# Patient Record
Sex: Female | Born: 1940 | Race: White | Hispanic: No | Marital: Married | Smoking: Never smoker
Health system: Southern US, Community
[De-identification: ages and names within clinical notes are randomized; demographics above are authoritative.]

## PROBLEM LIST (undated history)

## (undated) DIAGNOSIS — K219 Gastro-esophageal reflux disease without esophagitis: Secondary | ICD-10-CM

## (undated) DIAGNOSIS — IMO0001 Reserved for inherently not codable concepts without codable children: Secondary | ICD-10-CM

## (undated) HISTORY — PX: NM ESOPHAGEAL REFLUX: HXRAD613

## (undated) HISTORY — PX: HAND SURGERY: SHX662

## (undated) HISTORY — PX: OOPHORECTOMY: SHX86

## (undated) HISTORY — PX: BREAST ENHANCEMENT SURGERY: SHX7

## (undated) HISTORY — PX: ABDOMINAL HYSTERECTOMY: SHX81

---

## 2011-04-10 ENCOUNTER — Emergency Department (HOSPITAL_BASED_OUTPATIENT_CLINIC_OR_DEPARTMENT_OTHER)
Admission: EM | Admit: 2011-04-10 | Discharge: 2011-04-10 | Disposition: A | Discharge status: Home / Self Care | Payer: Medicare Other | Attending: Emergency Medicine | Admitting: Emergency Medicine

## 2011-04-10 ENCOUNTER — Encounter: Payer: Self-pay | Admitting: *Deleted

## 2011-04-10 ENCOUNTER — Emergency Department (INDEPENDENT_AMBULATORY_CARE_PROVIDER_SITE_OTHER): Payer: Medicare Other

## 2011-04-10 DIAGNOSIS — M25579 Pain in unspecified ankle and joints of unspecified foot: Secondary | ICD-10-CM

## 2011-04-10 DIAGNOSIS — S93409A Sprain of unspecified ligament of unspecified ankle, initial encounter: Secondary | ICD-10-CM | POA: Insufficient documentation

## 2011-04-10 DIAGNOSIS — S93402A Sprain of unspecified ligament of left ankle, initial encounter: Secondary | ICD-10-CM

## 2011-04-10 DIAGNOSIS — J45909 Unspecified asthma, uncomplicated: Secondary | ICD-10-CM | POA: Insufficient documentation

## 2011-04-10 DIAGNOSIS — W108XXA Fall (on) (from) other stairs and steps, initial encounter: Secondary | ICD-10-CM | POA: Insufficient documentation

## 2011-04-10 DIAGNOSIS — W19XXXA Unspecified fall, initial encounter: Secondary | ICD-10-CM

## 2011-04-10 HISTORY — DX: Reserved for inherently not codable concepts without codable children: IMO0001

## 2011-04-10 HISTORY — DX: Gastro-esophageal reflux disease without esophagitis: K21.9

## 2011-04-10 MED ORDER — ACETAMINOPHEN 500 MG PO TABS
1000.0000 mg | ORAL_TABLET | Freq: Once | ORAL | Status: DC
  Filled 2011-04-10: qty 2

## 2011-04-10 MED ORDER — IBUPROFEN 800 MG PO TABS: 800.0000 mg | ORAL_TABLET | Freq: Three times a day (TID) | ORAL | Status: AC

## 2011-04-10 MED ORDER — IBUPROFEN 800 MG PO TABS: 800.0000 mg | ORAL_TABLET | Freq: Three times a day (TID) | ORAL | Status: DC

## 2011-04-10 MED ORDER — KETOROLAC TROMETHAMINE 60 MG/2ML IM SOLN: 60.0000 mg | Freq: Once | INTRAMUSCULAR | Status: DC

## 2011-04-10 MED ORDER — IBUPROFEN 800 MG PO TABS
800.0000 mg | ORAL_TABLET | Freq: Once | ORAL | Status: AC
  Administered 2011-04-10: 800 mg via ORAL
  Filled 2011-04-10: qty 1

## 2011-04-10 NOTE — ED Notes (Signed)
Pt states she slipped off a step and injured her left ankle about 1pm today. Swelling at site.

## 2011-04-10 NOTE — ED Provider Notes (Signed)
History     Chief Complaint  Patient presents with  . Ankle Pain   HPI Comments: Patient fell down 3 steps several hours prior to arrival when she lost her balance. She fell on her left ankle causing a twisting motion. She had acute onset of pain, gradual onset of swelling and inability to bear without without severe pain. Symptoms are constant, moderate and has been treated with an ice pack prior to arrival.  Patient is a 70 y.o. female presenting with ankle pain. The history is provided by the patient and a relative.  Ankle Pain  Pertinent negatives include no numbness.    Past Medical History  Diagnosis Date  . Asthma   . Reflux     Past Surgical History  Procedure Date  . Hand surgery   . Breast enhancement surgery   . Nm esophageal reflux   . Cesarean section   . Oophorectomy   . Abdominal hysterectomy     History reviewed. No pertinent family history.  History  Substance Use Topics  . Smoking status: Never Smoker   . Smokeless tobacco: Not on file  . Alcohol Use: No    OB History    Grav Para Term Preterm Abortions TAB SAB Ect Mult Living                  Review of Systems  Gastrointestinal: Negative for nausea and vomiting.  Musculoskeletal: Positive for joint swelling.  Neurological: Negative for weakness and numbness.    Physical Exam  BP 122/57  Pulse 74  Temp(Src) 98 F (36.7 C) (Oral)  Resp 20  Ht 5\' 7"  (1.702 m)  Wt 220 lb (99.791 kg)  BMI 34.46 kg/m2  SpO2 98%  Physical Exam  Constitutional: She appears well-developed and well-nourished. No distress.  HENT:  Head: Normocephalic and atraumatic.  Eyes: Conjunctivae are normal. No scleral icterus.  Cardiovascular: Normal rate, regular rhythm and intact distal pulses.   Pulmonary/Chest: Effort normal and breath sounds normal.  Musculoskeletal: She exhibits tenderness. She exhibits no edema.       Left ankle with mild tenderness to palpation below the bilateral malleoli tips.  The  swelling in the same area. Normal pulses at the feet  Neurological: She is alert.       Normal sensation to touch bilateral feet.  Skin: Skin is warm and dry. No rash noted. She is not diaphoretic.    ED Course  Procedures  MDM X-rays reveal no signs of fracture.  Treated with Rice therapy including Toradol intramuscular, Ace wrap, elevation, ice pack.      Vida Roller, MD 04/10/11 [phone]

## 2012-07-10 IMAGING — CR DG ANKLE COMPLETE 3+V*L*
3 series · 3 of 3 positions shown · non-contrast
Comparison: None.

CLINICAL DATA: Fell, pain

LEFT ANKLE COMPLETE - 3+ VIEW

[t ankle joint ap left]
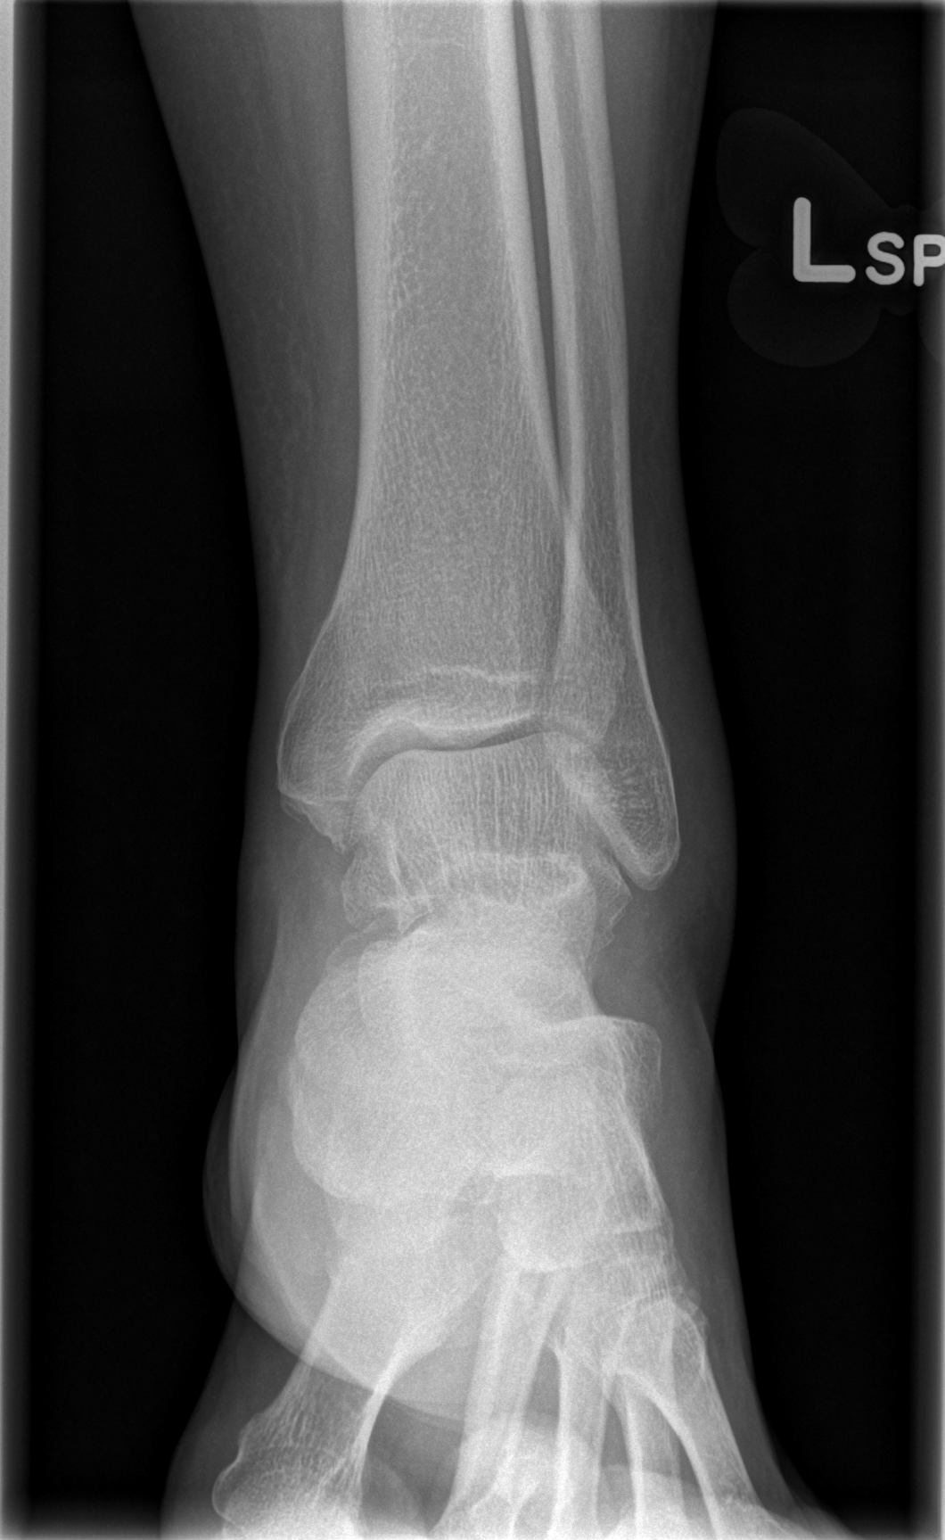

[t ankle joint oblique left]
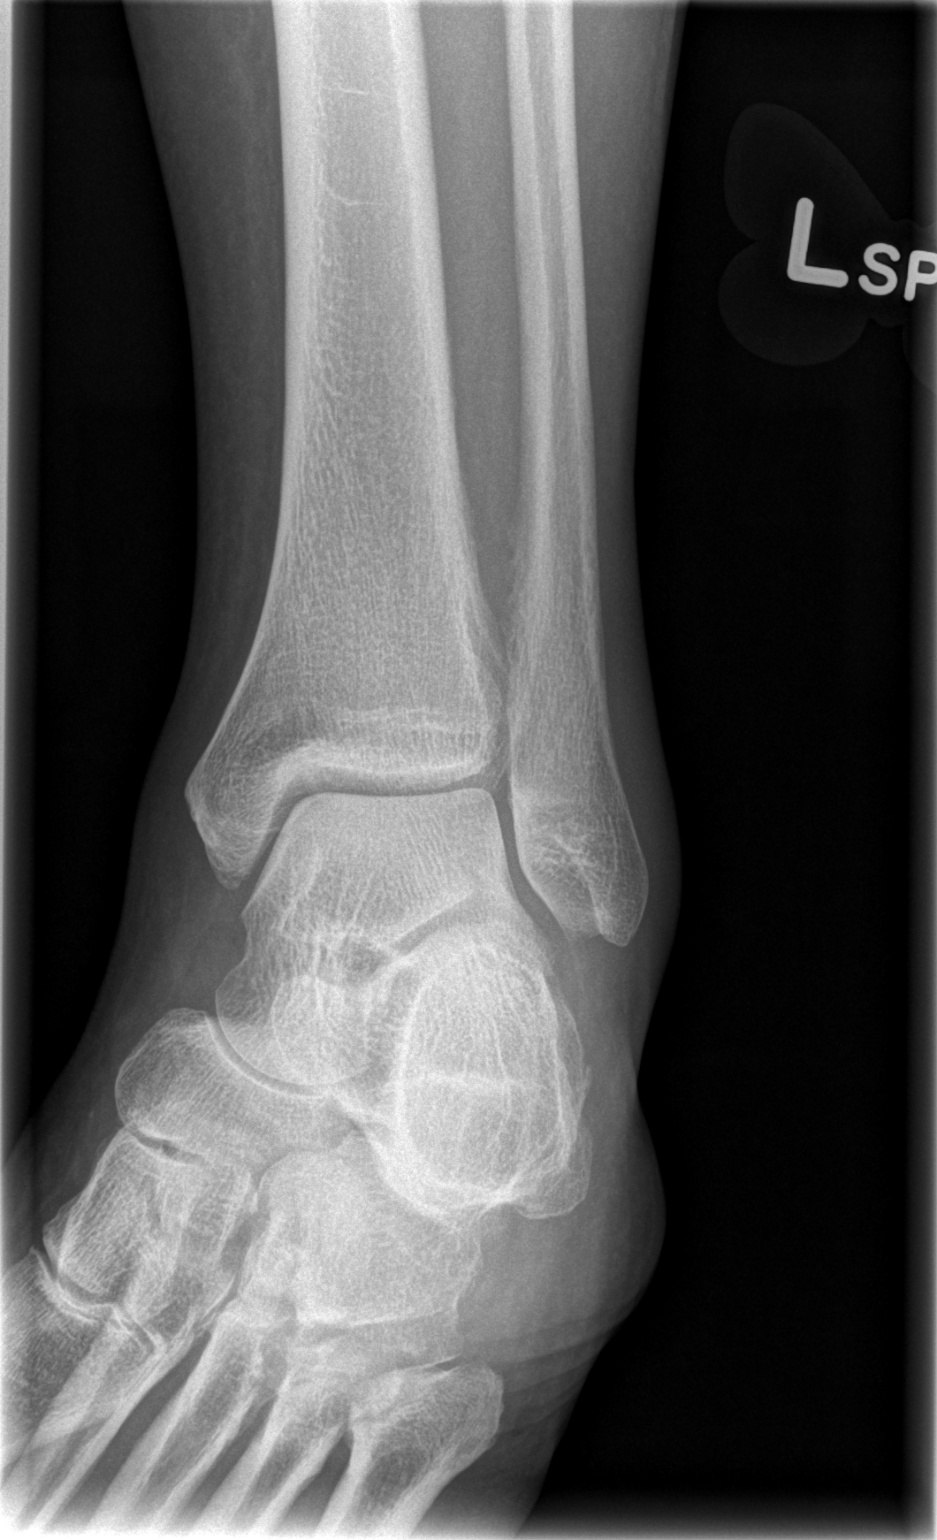

[t ankle joint lat left]
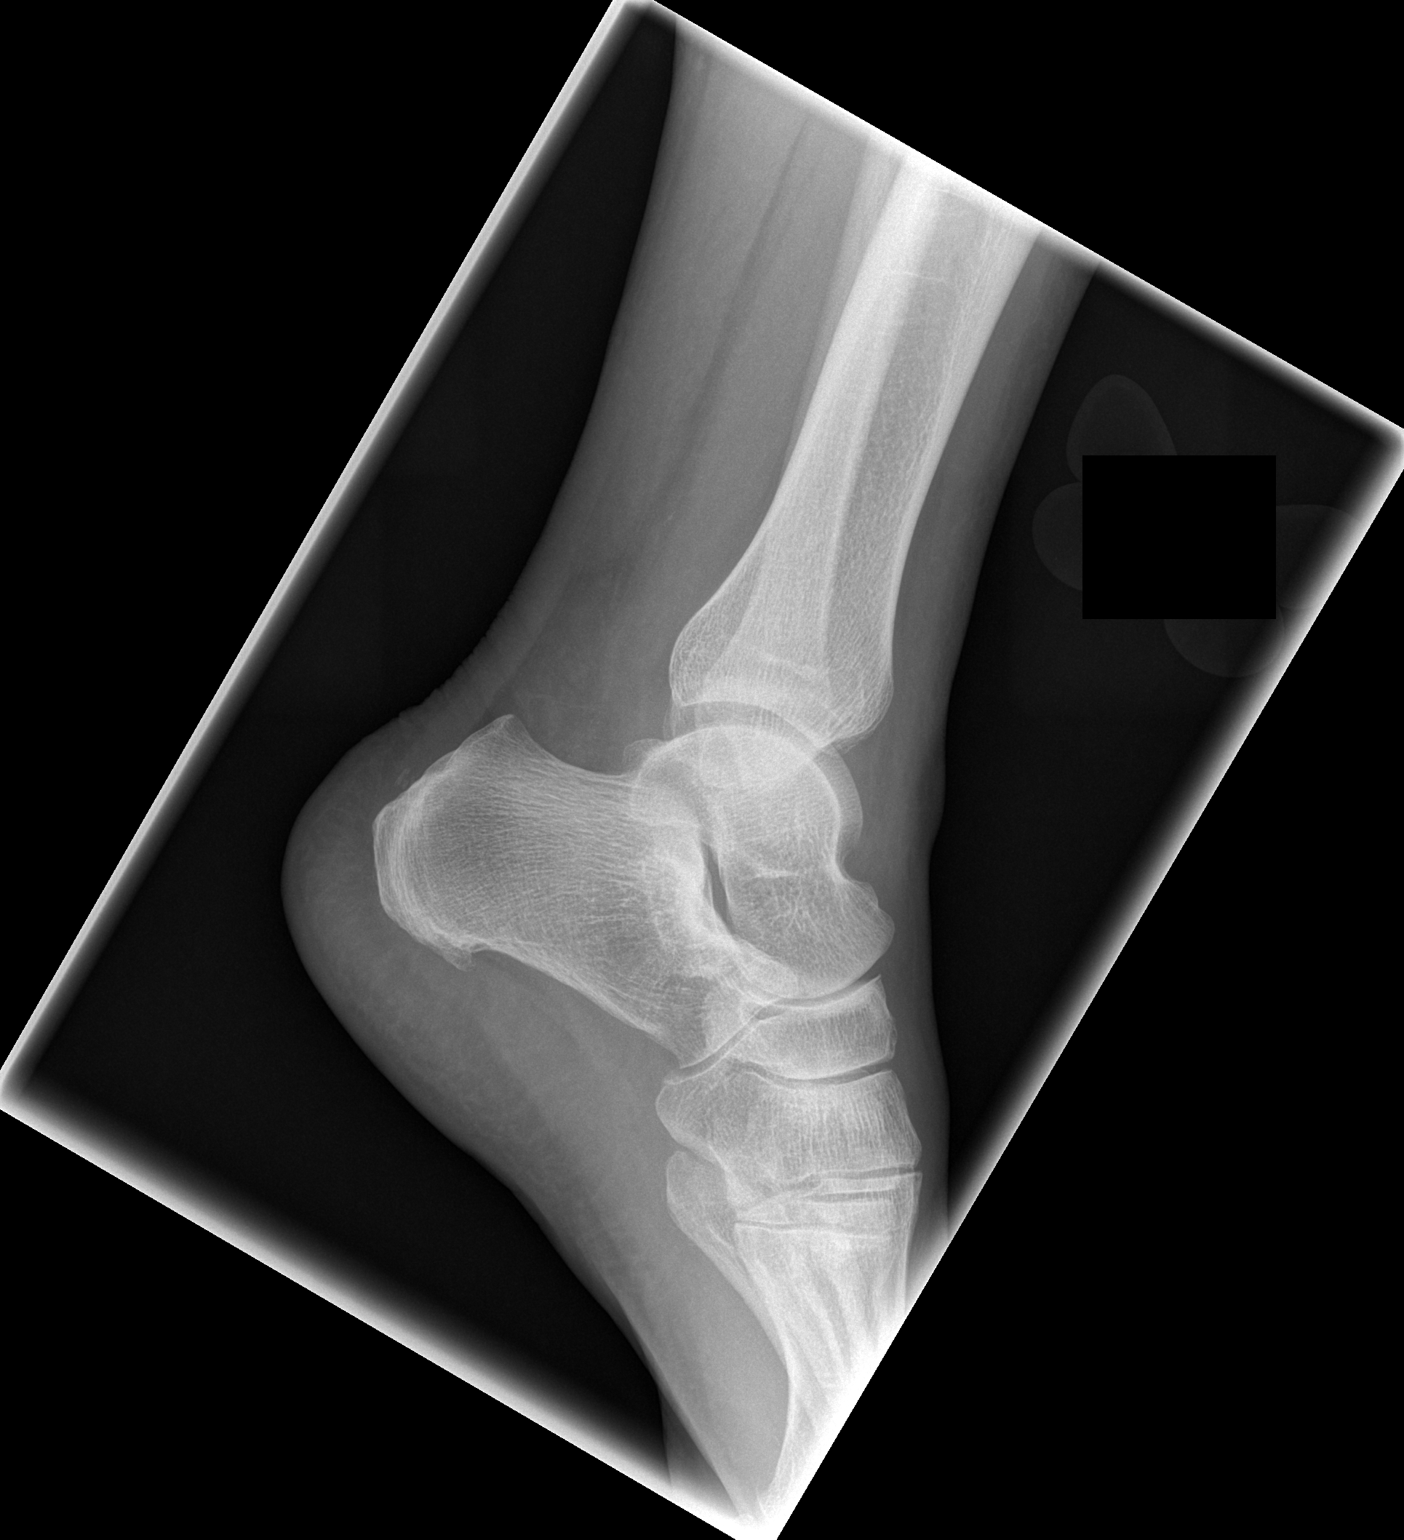

[3 of 3 positions shown; findings below may reference images not displayed]

FINDINGS: Lateral and anterior soft tissue swelling.  No fracture
or dislocation.
IMPRESSION: As above.

## 2022-04-15 ENCOUNTER — Emergency Department (HOSPITAL_COMMUNITY): Payer: Medicare Other

## 2022-04-15 ENCOUNTER — Other Ambulatory Visit: Payer: Self-pay

## 2022-04-15 ENCOUNTER — Observation Stay (HOSPITAL_COMMUNITY)
Admission: EM | Admit: 2022-04-15 | Discharge: 2022-04-16 | Disposition: A | Discharge status: Home / Self Care | Payer: Medicare Other | Attending: Family Medicine | Admitting: Family Medicine

## 2022-04-15 ENCOUNTER — Encounter (HOSPITAL_COMMUNITY): Payer: Self-pay

## 2022-04-15 DIAGNOSIS — I129 Hypertensive chronic kidney disease with stage 1 through stage 4 chronic kidney disease, or unspecified chronic kidney disease: Secondary | ICD-10-CM | POA: Diagnosis not present

## 2022-04-15 DIAGNOSIS — I2510 Atherosclerotic heart disease of native coronary artery without angina pectoris: Secondary | ICD-10-CM

## 2022-04-15 DIAGNOSIS — S0101XA Laceration without foreign body of scalp, initial encounter: Secondary | ICD-10-CM

## 2022-04-15 DIAGNOSIS — W01118A Fall on same level from slipping, tripping and stumbling with subsequent striking against other sharp object, initial encounter: Secondary | ICD-10-CM | POA: Insufficient documentation

## 2022-04-15 DIAGNOSIS — M5136 Other intervertebral disc degeneration, lumbar region: Secondary | ICD-10-CM

## 2022-04-15 DIAGNOSIS — R55 Syncope and collapse: Secondary | ICD-10-CM | POA: Diagnosis present

## 2022-04-15 DIAGNOSIS — M6281 Muscle weakness (generalized): Secondary | ICD-10-CM | POA: Diagnosis not present

## 2022-04-15 DIAGNOSIS — K219 Gastro-esophageal reflux disease without esophagitis: Secondary | ICD-10-CM

## 2022-04-15 DIAGNOSIS — Z79899 Other long term (current) drug therapy: Secondary | ICD-10-CM | POA: Insufficient documentation

## 2022-04-15 DIAGNOSIS — N3281 Overactive bladder: Secondary | ICD-10-CM

## 2022-04-15 DIAGNOSIS — F329 Major depressive disorder, single episode, unspecified: Secondary | ICD-10-CM

## 2022-04-15 DIAGNOSIS — S0990XA Unspecified injury of head, initial encounter: Secondary | ICD-10-CM | POA: Diagnosis not present

## 2022-04-15 DIAGNOSIS — R2689 Other abnormalities of gait and mobility: Secondary | ICD-10-CM | POA: Diagnosis not present

## 2022-04-15 DIAGNOSIS — Z7982 Long term (current) use of aspirin: Secondary | ICD-10-CM | POA: Insufficient documentation

## 2022-04-15 DIAGNOSIS — N183 Chronic kidney disease, stage 3 unspecified: Secondary | ICD-10-CM | POA: Insufficient documentation

## 2022-04-15 DIAGNOSIS — Z9882 Breast implant status: Secondary | ICD-10-CM | POA: Insufficient documentation

## 2022-04-15 LAB — URINALYSIS, ROUTINE W REFLEX MICROSCOPIC
Bilirubin Urine: NEGATIVE
Glucose, UA: NEGATIVE mg/dL
Hgb urine dipstick: NEGATIVE
Ketones, ur: NEGATIVE mg/dL
Nitrite: NEGATIVE
Protein, ur: NEGATIVE mg/dL
Specific Gravity, Urine: 1.008 (ref 1.005–1.030)
pH: 7 (ref 5.0–8.0)

## 2022-04-15 LAB — TROPONIN I (HIGH SENSITIVITY)
Troponin I (High Sensitivity): 4 ng/L (ref <18)
Troponin I (High Sensitivity): 5 ng/L (ref <18)

## 2022-04-15 LAB — CBC WITH DIFFERENTIAL/PLATELET
Abs Immature Granulocytes: 0.1 10*3/uL — ABNORMAL HIGH (ref 0.00–0.07)
Basophils Absolute: 0.1 10*3/uL (ref 0.0–0.1)
Basophils Relative: 1 %
Eosinophils Absolute: 0.1 10*3/uL (ref 0.0–0.5)
Eosinophils Relative: 0 %
HCT: 35 % — ABNORMAL LOW (ref 36.0–46.0)
Hemoglobin: 11.5 g/dL — ABNORMAL LOW (ref 12.0–15.0)
Immature Granulocytes: 1 %
Lymphocytes Relative: 6 %
Lymphs Abs: 0.7 10*3/uL (ref 0.7–4.0)
MCH: 29.5 pg (ref 26.0–34.0)
MCHC: 32.9 g/dL (ref 30.0–36.0)
MCV: 89.7 fL (ref 80.0–100.0)
Monocytes Absolute: 0.8 10*3/uL (ref 0.1–1.0)
Monocytes Relative: 7 %
Neutro Abs: 10.7 10*3/uL — ABNORMAL HIGH (ref 1.7–7.7)
Neutrophils Relative %: 85 %
Platelets: 279 10*3/uL (ref 150–400)
RBC: 3.9 MIL/uL (ref 3.87–5.11)
RDW: 13.6 % (ref 11.5–15.5)
WBC: 12.5 10*3/uL — ABNORMAL HIGH (ref 4.0–10.5)
nRBC: 0 % (ref 0.0–0.2)

## 2022-04-15 LAB — BASIC METABOLIC PANEL
Anion gap: 7 (ref 5–15)
BUN: 12 mg/dL (ref 8–23)
CO2: 23 mmol/L (ref 22–32)
Calcium: 8.6 mg/dL — ABNORMAL LOW (ref 8.9–10.3)
Chloride: 111 mmol/L (ref 98–111)
Creatinine, Ser: 0.93 mg/dL (ref 0.44–1.00)
GFR, Estimated: >60 mL/min (ref >60)
Glucose, Bld: 110 mg/dL — ABNORMAL HIGH (ref 70–99)
Potassium: 4.5 mmol/L (ref 3.5–5.1)
Sodium: 141 mmol/L (ref 135–145)

## 2022-04-15 LAB — CBG MONITORING, ED: Glucose-Capillary: 102 mg/dL — ABNORMAL HIGH (ref 70–99)

## 2022-04-15 MED ORDER — FESOTERODINE FUMARATE ER 4 MG PO TB24
4.0000 mg | ORAL_TABLET | Freq: Every day | ORAL | Status: DC
  Administered 2022-04-16: 4 mg via ORAL
  Filled 2022-04-15: qty 1

## 2022-04-15 MED ORDER — CALCIUM CARBONATE ANTACID 500 MG PO CHEW
1.0000 | CHEWABLE_TABLET | Freq: Every day | ORAL | Status: DC
  Administered 2022-04-15 – 2022-04-16 (×2): 200 mg via ORAL
  Filled 2022-04-15 (×2): qty 1

## 2022-04-15 MED ORDER — SODIUM CHLORIDE 0.9% FLUSH
3.0000 mL | Freq: Two times a day (BID) | INTRAVENOUS | Status: DC
  Administered 2022-04-15 – 2022-04-16 (×2): 3 mL via INTRAVENOUS

## 2022-04-15 MED ORDER — ENOXAPARIN SODIUM 40 MG/0.4ML IJ SOSY
40.0000 mg | PREFILLED_SYRINGE | INTRAMUSCULAR | Status: DC
Start: 2022-04-15 — End: 2022-04-16
  Administered 2022-04-15: 40 mg via SUBCUTANEOUS
  Filled 2022-04-15: qty 0.4

## 2022-04-15 MED ORDER — SERTRALINE HCL 100 MG PO TABS
100.0000 mg | ORAL_TABLET | Freq: Every day | ORAL | Status: DC
  Administered 2022-04-16: 100 mg via ORAL
  Filled 2022-04-15: qty 1

## 2022-04-15 MED ORDER — PANTOPRAZOLE SODIUM 40 MG PO TBEC
40.0000 mg | DELAYED_RELEASE_TABLET | Freq: Every day | ORAL | Status: DC
  Administered 2022-04-16: 40 mg via ORAL
  Filled 2022-04-15: qty 1

## 2022-04-15 MED ORDER — EZETIMIBE 10 MG PO TABS
10.0000 mg | ORAL_TABLET | Freq: Every day | ORAL | Status: DC
  Administered 2022-04-16: 10 mg via ORAL
  Filled 2022-04-15: qty 1

## 2022-04-15 MED ORDER — MIRABEGRON ER 25 MG PO TB24
50.0000 mg | ORAL_TABLET | Freq: Every day | ORAL | Status: DC
  Administered 2022-04-16: 50 mg via ORAL
  Filled 2022-04-15: qty 2

## 2022-04-15 MED ORDER — PRAVASTATIN SODIUM 40 MG PO TABS
20.0000 mg | ORAL_TABLET | Freq: Every day | ORAL | Status: DC
  Administered 2022-04-16: 20 mg via ORAL
  Filled 2022-04-15: qty 1

## 2022-04-15 MED ORDER — LIDOCAINE-EPINEPHRINE (PF) 2 %-1:200000 IJ SOLN
10.0000 mL | Freq: Once | INTRAMUSCULAR | Status: AC
  Administered 2022-04-15: 10 mL
  Filled 2022-04-15: qty 20

## 2022-04-15 MED ORDER — ASPIRIN 81 MG PO TBEC
81.0000 mg | DELAYED_RELEASE_TABLET | Freq: Every day | ORAL | Status: DC
  Administered 2022-04-16: 81 mg via ORAL
  Filled 2022-04-15: qty 1

## 2022-04-15 MED ORDER — ACETAMINOPHEN 325 MG PO TABS
650.0000 mg | ORAL_TABLET | Freq: Three times a day (TID) | ORAL | Status: DC | PRN
  Administered 2022-04-15: 650 mg via ORAL
  Filled 2022-04-15: qty 2

## 2022-04-15 NOTE — Hospital Course (Addendum)
Caitlin Johnston is a 81 year old female who presented with syncope and fall.  Pertinent past medical history of CAD, hypertension, uncertain arrhythmia.  Patient was admitted to family medicine teaching service for syncope work-up. Please see problem based hospital course below:  Syncope and fall Patient arrived to the hospital after syncope and fall.  EMS noted heart rate to be 25 en route.  In the ED vitals were stable, electrolytes normal, troponin flat, CT head negative, and EKG was normal. Home metoprolol was held given concern for profound bradycardia with EMS. A1c, TSH, and orthostatic vitals also unremarkable. ECHO showed Grade 1 diastolic dysfunction but was otherwise unremarkable. Patient remained asymptomatic with normal heart rate while in hospital. No arrhythmias noted on continuous telemetry.  Spoke with cardiology, who recommended follow-up in 1-week with her established cardiologist for outpatient cardiac monitoring. Patient discharged in stable condition. Home metoprolol held on discharge.  Follow up recommendations: Stopped metoprolol on admission out of concern for bradycardia to 25 bpm in ambulance. PCP to follow up tolerance. Would be hesitant to restart given possibility of episodic bradycardia.  Recommend outpatient cardiology follow up. Would recommend Zio patch. Our records indicate she sees [facility] cardiology already.  Reports dizziness with certain head turn motions. Consider BPV. May need neurovestibular eval and treat.

## 2022-04-15 NOTE — ED Triage Notes (Signed)
Pt reports walking on patio. Pt felt "funny" then passed out for a few seconds landing face first on concrete floor. Hematoma to forehead noted. Alert and oriented x 4. Denies dizziness, blurred vision. Reports headache and nausea.

## 2022-04-15 NOTE — Assessment & Plan Note (Addendum)
Acute, symptoms resolved.  Most likely arrhythmia, plan to monitor on cardiac telemetry.  Negative studies thus far include CT head and C-spine, EKG, troponin. TSH normal, A1c normal. Orthostatic vital signs normal. ECHO grade 1 diastolic -Repeat EKG in AM -Cardiology curbsided, recommendations to follow-up in less than 1 week with outpatient cardiologist for monitor and no driving  -Continuous cardiac Monitoring -Vitals per floor routine -ECHO -AM CMP and CBC -Hold home Metoprolol for now given bradycardia en route

## 2022-04-15 NOTE — Assessment & Plan Note (Signed)
-  Continue Myrbetriq and formulary alternative Toviaz

## 2022-04-15 NOTE — Assessment & Plan Note (Addendum)
Troponin and EKG unremarkable. N/STEMI unlikely cause of syncopal episode. - Continue home aspirin 80 mg daily - Continue home pravastatin 20 mg daily and Zetia 10 mg daily

## 2022-04-15 NOTE — ED Notes (Signed)
This RN and Marjean Donna ED Tech assisted patient to bedside commode at this time. Patient had 1 urine occurrence.

## 2022-04-15 NOTE — Discharge Instructions (Addendum)
Dear Gaston Islam,   Thank you so much for allowing [place] to be part of your care!  You were admitted to [facility] for evaluation of your syncopal episode. We are glad you are feeling better!    POST-HOSPITAL & CARE INSTRUCTIONS Call and follow-up with your Cardiologist for an appointment in 1 week. Follow-up with your primary care provider to evaluate the staples over the cut on your head. Stop taking your home metoprolol.  RETURN PRECAUTIONS: If you continue to have episodes of passing out, have any new weakness or difficulty breathing   Take care and be well!  Family Medicine Teaching Service Inpatient Team [place]  [facility]  [address] [place], [place] [address] [phone]

## 2022-04-15 NOTE — Progress Notes (Signed)
I have seen and evaluated this patient. I will discuss management plan with the resident and cosign their H&P once it is completed.

## 2022-04-15 NOTE — ED Provider Notes (Signed)
[facility]   CSN: [id number] Arrival date & time: 04/15/22  [id number]     History  Chief Complaint  Patient presents with   Caitlin Johnston    Caitlin Johnston is a 81 y.o. female.  Caitlin Johnston is a 81 year old female with past medical history of CKD, hypertension, high cholesterol, and anxiety presenting today for syncopal episode and fall.  Reports that at this morning around 830 she was turning on the outside faucet to water her flowers. She was bent over for a few seconds, passed out, and hit her head on the concrete floor.  Endorses a few seconds of LOC. Denies dizziness or visual disturbance.  Denies any changes in range of motion of extremities, sensation, or speech.  Reports that this is the first time she is ever passed out.  Denies headache but does endorse soreness in the location where she struck her head.  EMT report revealed that in route her heart rate was in the mid 20s.    Fall Pertinent negatives include no chest pain and no headaches.       Home Medications Prior to Admission medications   Medication Sig Start Date End Date Taking? Authorizing Provider  aspirin 81 MG EC tablet Take 81 mg by mouth daily.     Yes [provider]  fexofenadine (ALLEGRA) 180 MG tablet Take 180 mg by mouth daily as needed for allergies.   Yes [provider]  metoprolol (TOPROL-XL) 50 MG 24 hr tablet Take 25 mg by mouth daily.   Yes [provider]  omeprazole (PRILOSEC) 20 MG capsule Take 20 mg by mouth 2 (two) times daily before a meal.   Yes [provider]  pravastatin (PRAVACHOL) 20 MG tablet Take 20 mg by mouth daily.   Yes [provider]  sertraline (ZOLOFT) 100 MG tablet Take 100 mg by mouth daily.     Yes [provider]  solifenacin (VESICARE) 5 MG tablet Take 5 mg by mouth daily. 03/12/22  Yes [provider]      Allergies    Avocado, Cat hair extract, Cephalexin, Codeine, Peach  flavor, Thyme, Tree nuts [nuts], and Tylenol [acetaminophen]    Review of Systems   Review of Systems  Cardiovascular:  Negative for chest pain and palpitations.  Gastrointestinal:  Negative for nausea.  Neurological:  Positive for syncope and weakness. Negative for dizziness, facial asymmetry, speech difficulty, light-headedness and headaches.    Physical Exam Updated Vital Signs BP (!) 123/51   Pulse 61   Temp 97.6 F (36.4 C) (Oral)   Resp 12   Ht 5\' 5"  (1.651 m)   Wt 87.5 kg   SpO2 96%   BMI 32.12 kg/m  Physical Exam Vitals and nursing note reviewed.  HENT:     Head: Normocephalic and atraumatic.     Mouth/Throat:     Mouth: Mucous membranes are moist.  Eyes:     General:        Right eye: No discharge.        Left eye: No discharge.     Conjunctiva/sclera: Conjunctivae normal.  Cardiovascular:     Rate and Rhythm: Normal rate and regular rhythm.     Pulses: Normal pulses.     Heart sounds: Normal heart sounds.  Pulmonary:     Effort: Pulmonary effort is normal.     Breath sounds: Normal breath sounds.  Abdominal:     General: Abdomen is flat.  Palpations: Abdomen is soft.  Skin:    General: Skin is warm and dry.     Comments: Laceration/wound with dried blood circumferentially, ecchymosis and mild swelling noted at the   Neurological:     General: No focal deficit present.     Mental Status: She is oriented to person, place, and time. Mental status is at baseline.     Cranial Nerves: No cranial nerve deficit.     Sensory: No sensory deficit.     Coordination: Coordination normal.  Psychiatric:        Mood and Affect: Mood normal.     ED Results / Procedures / Treatments   Labs (all labs ordered are listed, but only abnormal results are displayed) Labs Reviewed  CBC WITH DIFFERENTIAL/PLATELET - Abnormal; Notable for the following components:      Result Value   WBC 12.5 (*)    Hemoglobin 11.5 (*)    HCT 35.0 (*)    Neutro Abs 10.7 (*)    Abs  Immature Granulocytes 0.10 (*)    All other components within normal limits  BASIC METABOLIC PANEL - Abnormal; Notable for the following components:   Glucose, Bld 110 (*)    Calcium 8.6 (*)    All other components within normal limits  CBG MONITORING, ED - Abnormal; Notable for the following components:   Glucose-Capillary 102 (*)    All other components within normal limits  URINALYSIS, ROUTINE W REFLEX MICROSCOPIC  TROPONIN I (HIGH SENSITIVITY)  TROPONIN I (HIGH SENSITIVITY)    EKG None  Radiology CT Head Wo Contrast  Result Date: 04/15/2022 CLINICAL DATA:  Head trauma, syncopal episode, fall striking LEFT side of head, laceration in LEFT temporal region EXAM: CT HEAD WITHOUT CONTRAST CT CERVICAL SPINE WITHOUT CONTRAST TECHNIQUE: Multidetector CT imaging of the head and cervical spine was performed following the standard protocol without intravenous contrast. Multiplanar CT image reconstructions of the cervical spine were also generated. RADIATION DOSE REDUCTION: This exam was performed according to the departmental dose-optimization program which includes automated exposure control, adjustment of the mA and/or kV according to patient size and/or use of iterative reconstruction technique. COMPARISON:  01/06/2021 FINDINGS: CT HEAD FINDINGS Brain: Mild generalized atrophy. Normal ventricular morphology. No midline shift or mass effect. Otherwise normal appearance of brain parenchyma. No intracranial hemorrhage, mass lesion or evidence of acute infarction. No extra-axial fluid collections. Vascular: Atherosclerotic calcification of internal carotid arteries at skull base. No hyperdense vessels. Skull: Intact. Mild soft tissue swelling LEFT frontotemporal region with laceration. Sinuses/Orbits: Clear Other: N/A CT CERVICAL SPINE FINDINGS Alignment: Minimal anterolisthesis at C3-C4 and C4-C5. Alignments otherwise normal Skull base and vertebrae: Osseous mineralization normal. Disk space narrowing  and endplate sclerosis C5-C6. Disk space narrowing at C6-C7. Multilevel degenerative facet disease changes throughout cervical region. No fracture, subluxation, or bone destruction. Soft tissues and spinal canal: Prevertebral soft tissue normal thickness Disc levels:  Unremarkable Upper chest: Upper lungs clear Other: N/A IMPRESSION: Generalized atrophy. No acute intracranial abnormalities. Multilevel degenerative disc and facet disease changes of the cervical spine. No acute cervical spine abnormalities. Electronically Signed   By: Ulyses Southward M.D.   On: 04/15/2022 11:46   CT Cervical Spine Wo Contrast  Result Date: 04/15/2022 CLINICAL DATA:  Head trauma, syncopal episode, fall striking LEFT side of head, laceration in LEFT temporal region EXAM: CT HEAD WITHOUT CONTRAST CT CERVICAL SPINE WITHOUT CONTRAST TECHNIQUE: Multidetector CT imaging of the head and cervical spine was performed following the standard protocol without intravenous contrast.  Multiplanar CT image reconstructions of the cervical spine were also generated. RADIATION DOSE REDUCTION: This exam was performed according to the departmental dose-optimization program which includes automated exposure control, adjustment of the mA and/or kV according to patient size and/or use of iterative reconstruction technique. COMPARISON:  01/06/2021 FINDINGS: CT HEAD FINDINGS Brain: Mild generalized atrophy. Normal ventricular morphology. No midline shift or mass effect. Otherwise normal appearance of brain parenchyma. No intracranial hemorrhage, mass lesion or evidence of acute infarction. No extra-axial fluid collections. Vascular: Atherosclerotic calcification of internal carotid arteries at skull base. No hyperdense vessels. Skull: Intact. Mild soft tissue swelling LEFT frontotemporal region with laceration. Sinuses/Orbits: Clear Other: N/A CT CERVICAL SPINE FINDINGS Alignment: Minimal anterolisthesis at C3-C4 and C4-C5. Alignments otherwise normal Skull base  and vertebrae: Osseous mineralization normal. Disk space narrowing and endplate sclerosis C5-C6. Disk space narrowing at C6-C7. Multilevel degenerative facet disease changes throughout cervical region. No fracture, subluxation, or bone destruction. Soft tissues and spinal canal: Prevertebral soft tissue normal thickness Disc levels:  Unremarkable Upper chest: Upper lungs clear Other: N/A IMPRESSION: Generalized atrophy. No acute intracranial abnormalities. Multilevel degenerative disc and facet disease changes of the cervical spine. No acute cervical spine abnormalities. Electronically Signed   By: Ulyses Southward M.D.   On: 04/15/2022 11:46   DG Chest 2 View  Result Date: 04/15/2022 CLINICAL DATA:  81 year old female presenting with history of fall. EXAM: CHEST - 2 VIEW COMPARISON:  March 12, 2020 FINDINGS: EKG leads project over the chest. Cardiomediastinal contours and hilar structures are stable top-normal heart size. No lobar consolidation. Subtle basilar airspace disease with slightly diminished lung volumes compared to previous imaging. No sign of pleural effusion. No pneumothorax. On limited assessment no acute skeletal findings. IMPRESSION: Subtle basilar airspace disease with slightly diminished lung volumes compared to previous imaging. No lobar consolidation or pleural effusion. Findings could reflect mild atelectasis. Electronically Signed   By: Donzetta Kohut M.D.   On: 04/15/2022 10:57    Procedures .Marland KitchenLaceration Repair  Date/Time: 04/15/2022 12:30 PM  Performed by: Gareth Eagle, PA-C Authorized by: Gareth Eagle, PA-C   Consent:    Consent obtained:  Verbal   Consent given by:  Patient   Risks, benefits, and alternatives were discussed: yes     Risks discussed:  Infection, pain and poor wound healing Universal protocol:    Procedure explained and questions answered to patient or proxy's satisfaction: yes     Patient identity confirmed:  Verbally with patient Anesthesia:     Anesthesia method:  Local infiltration   Local anesthetic:  Lidocaine 2% WITH epi Laceration details:    Location:  Scalp   Scalp location:  Frontal   Length (cm):  3   Depth (mm):  10 Pre-procedure details:    Preparation:  Imaging obtained to evaluate for foreign bodies Exploration:    Hemostasis achieved with:  Epinephrine   Wound extent: areolar tissue violated     Wound extent: no underlying fracture noted   Treatment:    Area cleansed with:  Shur-Clens   Amount of cleaning:  Extensive   Irrigation method:  Pressure wash   Visualized foreign bodies/material removed: no     Debridement:  Minimal Skin repair:    Repair method:  Staples   Number of staples:  2 Approximation:    Approximation:  Close Repair type:    Repair type:  Simple Post-procedure details:    Dressing:  Non-adherent dressing   Procedure completion:  Tolerated well, no immediate complications  Medications Ordered in ED Medications  lidocaine-EPINEPHrine (XYLOCAINE W/EPI) 2 %-1:200000 (PF) injection 10 mL (10 mLs Infiltration Given by Other 04/15/22 1239)    ED Course/ Medical Decision Making/ A&P Clinical Course as of 04/15/22 1429  Thu Apr 15, 2022  1239 ED EKG [JR]  1424 Glucose(!): 110 [JR]    Clinical Course User Index [JR] Gareth Eagle, PA-C                           Medical decision making This patient presents to the ED for concern of syncope and head laceration, this involves a number of treatment options, and is a complaint that carries with it a moderate risk of complications and morbidity.  The differential diagnosis includes arrhythmia, syncope related to aortic stenosis, and stroke. At this time arrhythmia is most concerning as possible etiology given episode of syncope today and pre-syncope earlier this morning at rest coupled with noted bradycardia (heart reate ~20s) in transit to the emergency department. Aortic stenosis is less likely, no murmur on exam and BP is well  controlled on metoprolol at home. Will also consider stroke/head bleed given fall and subsequent head trauma.   Arrhythmia: Ordered and reviewed EKG which revealed normal sinus rhythm.  At admitting to the hospital however for further eval evaluation.   Stroke/head bleed: Ordered noncontrast head CT with cervical spine.  Neuro neuro exam is overall reassuring with no focal deficits alert and oriented x3.      Co morbidities: Discussed in HPI   EMR reviewed including pt PMHx, past surgical history and past visits to ER.   See HPI for more details   Lab Tests:   I ordered and independently interpreted labs. Labs notable for leukocytosis, troponin WNL, hyperglycemic  Imaging Studies:  NAD. I personally reviewed all imaging studies and no acute abnormality found. I agree with radiology interpretation.    Cardiac Monitoring:  The patient was maintained on a cardiac monitor.  I personally viewed and interpreted the cardiac monitored which showed an underlying rhythm of: sinus rhythm EKG non-ischemic   Medicines ordered:  I ordered medication including lidocaine-epi for local anesthetic for head lac repair. Reevaluation of the patient after these medicines showed that the patient improved I have reviewed the patients home medicines and have made adjustments as needed      Consults/Attending Physician   I requested consultation with hospitalist team,  and discussed lab and imaging findings as well as pertinent plan - they recommend: admission to hospital for observation and evaluation.   Reevaluation:  After the interventions noted above I re-evaluated patient and found that they have :improved   Problem List / ED Course: Patient presented with syncope and associated with fall, and bradycardia on arrival concerning for arrhythmogenic syncope.  EKG was normal troponins were normal and cardiac exam was overall reassuring.  Due to mechanism of fall also ordered CT of head  and neck which were unremarkable.  Given the presentation of syncope fact that she has also had a presyncopal episode at home while while at rest is also concerning for cardiac etiology.  Consulted hospitalist team and recommended that we admit for further investigation.   Dispostion:  After consideration of the diagnostic results and the patients response to treatment, I feel that the patent would benefit from admission to the hospital for further investigation and management by the hospital team.        Final Clinical Impression(s) / ED Diagnoses  Final diagnoses:  Syncope, unspecified syncope type  Injury of head, initial encounter  Laceration of scalp, initial encounter    Rx / DC Orders ED Discharge Orders     None         Gareth Eagle, PA-C 04/15/22 1441    Milagros Loll, MD 04/16/22 [phone]

## 2022-04-15 NOTE — Assessment & Plan Note (Signed)
Scalp laceration hemostatic at this time, staples applied in ED.  CT head and C-spine negative.  Patient is on aspirin 81 mg daily, she has not missed any doses.  Small potential for delayed brain bleed.  We will continue to monitor closely.

## 2022-04-15 NOTE — Assessment & Plan Note (Signed)
-  Continue Prilosec and Tums

## 2022-04-15 NOTE — Assessment & Plan Note (Signed)
No complaints of lumbar pain at this time after fall.  Patient does have home tramadol 50 mg tablets every 6 as needed, which she reports taking very sparingly.  Will not order at this time, would start with more conservative measures first such as lidocaine patch and tylenol.

## 2022-04-15 NOTE — H&P (Addendum)
Hospital Admission History and Physical Service Pager: [phone]  Patient name: Caitlin Johnston Medical record number: [id number] Date of Birth: 03/17/1941 Age: 81 y.o. Gender: female  Primary Care Provider: Pcp, No Consultants: None Code Status: FULL Preferred Emergency Contact:  Hamil,Larry (Son)  [phone] Wandra Feinstein (Daughter in [place]) [phone] Has living will in place "and all that"  Chief Complaint: Fall from passing out  Assessment and Plan: [name] is a 81 y.o. female presenting with syncope and fall.  Pertinent past medical history includes hypertension, CAD, and CKD 3. Differential for this patient's presentation of this includes arrhythmia, ACS, valvular pathology, heart failure, stroke, electrolyte abnormality, and orthostasis .  Most likely etiology is arrhythmia with reported history of intermittent VT on Holter monitor, and reported heart rate of 25 with EMS prior to arrival to this hospitalization acute, now resolved.  Orthostasis possible but patient reports good oral intake and symptoms not positional. ACS is unlikely with normal EKG and troponin.  Heart failure is unlikely without clinical signs of volume overload.  Stroke is unlikely with grossly normal neuro exam and normal CT head.  Electrolyte abnormalities ruled out with normal BMP.  * Syncope and collapse Acute, symptoms resolved.  Most likely arrhythmia, plan to monitor on cardiac telemetry.  Negative studies thus far include CT head and C-spine, EKG, troponin. -Admit to [facility] Medicine Teaching Service, Attending Dr. Janit Pagan -Repeat EKG in AM -Orthostatic vital signs follow-up -Continuous cardiac Monitoring -Vitals per floor routine -ECHO -AM CMP and CBC -TSH and A1c -Hold home Metoprolol for now given bradycardia en route  Head injury Scalp laceration hemostatic at this time, staples applied in ED.  CT head and C-spine negative.  Patient is on aspirin 81 mg daily, she has not missed  any doses.  Small potential for delayed brain bleed.  We will continue to monitor closely.  CAD (coronary artery disease) Troponin and EKG unremarkable. N/STEMI unlikely cause of syncopal episode. - Continue home aspirin 80 mg daily - Continue home pravastatin 20 mg daily and Zetia 10 mg daily  Overactive bladder -Continue Myrbetriq and formulary alternative Toviaz  GERD (gastroesophageal reflux disease) -Continue Prilosec and Tums  MDD (major depressive disorder) -Continue home Zoloft  Degenerative disc disease, lumbar No complaints of lumbar pain at this time after fall.  Patient does have home tramadol 50 mg tablets every 6 as needed, which she reports taking very sparingly.  Will not order at this time, would start with more conservative measures first such as lidocaine patch and tylenol.     FEN/GI: Heart healthy VTE Prophylaxis: Lovenox  Disposition: Med tele  History of Present Illness:  Caitlin Johnston is a 81 y.o. female presenting with syncope and fall.  Today is the first time she has experienced syncope, she was talking to her daughter this morning and did not feel dizzy,but felt like she was going to "fade out." Reports she got up, got dressed, took medicine, went out to yard to do some light yard work. Had picked up several sticks successfully and went to get ready for a lunch date. She had locked the door and was going to water flowers before her friend got there, bent over to get the hose and felt the same light headed feeling, and passed out. When she came to, she had to crawl to the outdoor table to reach her phone to call her neighbor. Neighbor called 911 and waited with her. Afterwards, felt very weak even in the ambulance.  Does  endorse other episodes, she has spells at random with any activity or none where she feels weak, shaky, and light headed. States she has had these spells for a couple years. They are rare, happening maybe once or twice per month; relieved by  sitting. She notes she has had decreased appetite in the last couple of years since her husband passed. When she walks she gets very exhausted and out of breath.  Pertinent: Per her PCP note from 02/24/2022 cardiology note from 02/17/2022, patient wore Holter monitor for cardiac events while she lived in [place] that showed intermittent runs of ventricular tachycardia that were asymptomatic and why patient was put on metoprolol.  ED Course: In the ED, patient's vitals were stable, troponin was not elevated normal, and EKG was reassuring. BMP revealed no electrolyte abnormalities, CBC with mild elevation in white count to 12.5, and mild decrease in hemoglobin to 11.5.  Patient was injected with lidocaine for lack repair to head.   Review Of Systems: Per HPI with the following additions: Patient states has "old lady bladder". No GI issues. No new cough or headache, or blurry vision.  Pertinent Past Medical History: Hypertension CAD CKD 3 MDD Lumbar stenosis GERD Overactive bladder  Remainder reviewed in history tab.   Pertinent Past Surgical History: Hysterectomy 81 years old Ovaries out at 81 years old Back surgery for nerve impingement, lower back, about 5 years ago Both knees operated, scopes (NOT replaced) Bilateral breast implants, multiple breast biopsies  Remainder reviewed in history tab.   Pertinent Social History: Tobacco use: Never Alcohol use: Very little, occasional  Other Substance use: None Lives with 79 yo adopted daughter (adopted at 41 years old)  Pertinent Family History: Father-heart disease, stroke Mother-lung cancer  Other: Stroke in uncle Lung cancer stage 4, gma and gpa Sister with ovarian cancer Lots of lung trouble in family, worsened by alfalfa and smoking  Remainder reviewed in history tab.   Important Outpatient Medications: Vitamin C Aspirin Estradiol cream Ezetimibe Flonase as needed Does not take Robaxin Metoprolol 25 mg daily  mirabegron 50 mg daily multivitamin Prilosec 20 mg before meals Sertraline 100 mg Pravastatin 20 mg Vesicare 5 mg tablet Tramadol as needed for back pain Remainder reviewed in medication history.   Objective: BP (!) 117/55   Pulse 72   Temp 98.2 F (36.8 C) (Oral)   Resp 16   Ht 5\' 5"  (1.651 m)   Wt 87.5 kg   SpO2 92%   BMI 32.12 kg/m  Exam: General: A&O, NAD, eating throughout visit HEENT: No sign of trauma, EOM grossly intact Cardiac: RRR, no m/r/g Respiratory: CTAB, normal WOB, no w/c/r GI: Soft, NTTP, non-distended  Extremities: NTTP, no peripheral edema. Psych: Appropriate mood and affect  Neuro: No focal deficits.  Memory: Intact . PEERLA. Cranial nerves: II through XII are intact. Sensation normal upper and lower ext's bilaterally. Strength 5/5 in upper and lower ext's bilaterally FTN, rapid hand alternating movements normal bilaterally.   Labs:  CBC BMET  Recent Labs  Lab 04/15/22 1101  WBC 12.5*  HGB 11.5*  HCT 35.0*  PLT 279   Recent Labs  Lab 04/15/22 1101  NA 141  K 4.5  CL 111  CO2 23  BUN 12  CREATININE 0.93  GLUCOSE 110*  CALCIUM 8.6*    Pertinent additional labs Trop 4>5.   EKG: normal sinus rhythm, normal axis, PR interval near standard for first degree heart block at 197, no new ST changes   Imaging Studies Performed:  CT Cervical Spine Wo Contrast  IMPRESSION: Generalized atrophy. No acute intracranial abnormalities. Multilevel degenerative disc and facet disease changes of the cervical spine. No acute cervical spine abnormalities.  CT Head Wo Contrast  IMPRESSION: Generalized atrophy. No acute intracranial abnormalities. Multilevel degenerative disc and facet disease changes of the cervical spine. No acute cervical spine abnormalities.  DG Chest 2 IMPRESSION: Subtle basilar airspace disease with slightly diminished lung volumes compared to previous imaging. No lobar consolidation or pleural effusion. Findings could  reflect mild atelectasis.  My Interpretation: Cardiomegaly, bilateral lower lung zone haziness, diaphragm visible   Salvadore Oxford, MD 04/15/2022, 2:37 PM PGY-1, [place] Intern pager: [phone], text pages welcome Secure chat group [facility] Teaching Service   Upper Level Addendum: I have seen and evaluated this patient along with Dr. Ruben Im and reviewed the above note, making necessary revisions as appropriate. I agree with the medical decision making and physical exam as noted above. Ezequiel Essex, MD PGY-3 [place]

## 2022-04-15 NOTE — Assessment & Plan Note (Signed)
   Continue home Zoloft 

## 2022-04-15 NOTE — Progress Notes (Signed)
Family medicine teaching service will be admitting this patient. Our pager information can be located in the physician sticky notes, treatment team sticky notes, and the headers of all our official daily progress notes.   FAMILY MEDICINE TEACHING SERVICE Patient - Please contact intern pager [phone] or text page via website [email or web address] (login: [email or web address]) for questions regarding care. DO NOT page listed attending provider unless there is no answer from the number above.   Phila Shoaf, MD PGY-3,  Family Medicine Service pager [phone]   

## 2022-04-15 NOTE — Progress Notes (Signed)
FMTS Brief Progress Note  S:Patient reports 4/10 head pain around her left sided laceration side. Reports minimal spread of headache. Denies vision changes, light headedness, or any other symptoms.    O: BP (!) 124/54 (BP Location: Left Arm)   Pulse 60   Temp 98.4 F (36.9 C) (Oral)   Resp 16   Ht 5\' 5"  (1.651 m)   Wt 87.5 kg   SpO2 96%   BMI 32.12 kg/m    General: well appearing, laying in bed.  Neuro: alert and oriented to situation, conversant, PERRLA, upper extremity strength 5/5 bilaterally.   A/P: Headache - ordered tylenol, continue to monitor and image if headache worsens.   - Orders reviewed.    , MD 04/15/2022, 8:33 PM PGY-1, [facility] Night Resident  Please page [phone] with questions.

## 2022-04-15 NOTE — ED Notes (Signed)
Admitting provider at bedside at this time to evaluate patient and update her on plan of care and admission status to hospital.

## 2022-04-15 NOTE — ED Notes (Signed)
CN notified RN that pt HR dropped to 25 bpm en route to ED. Provider notified at this time.

## 2022-04-16 ENCOUNTER — Observation Stay (HOSPITAL_BASED_OUTPATIENT_CLINIC_OR_DEPARTMENT_OTHER): Payer: Medicare Other

## 2022-04-16 DIAGNOSIS — R55 Syncope and collapse: Secondary | ICD-10-CM

## 2022-04-16 DIAGNOSIS — I129 Hypertensive chronic kidney disease with stage 1 through stage 4 chronic kidney disease, or unspecified chronic kidney disease: Secondary | ICD-10-CM | POA: Diagnosis not present

## 2022-04-16 DIAGNOSIS — I2510 Atherosclerotic heart disease of native coronary artery without angina pectoris: Secondary | ICD-10-CM | POA: Diagnosis not present

## 2022-04-16 DIAGNOSIS — S0990XA Unspecified injury of head, initial encounter: Secondary | ICD-10-CM | POA: Diagnosis not present

## 2022-04-16 LAB — COMPREHENSIVE METABOLIC PANEL
ALT: 9 U/L (ref 0–44)
AST: 11 U/L — ABNORMAL LOW (ref 15–41)
Albumin: 3.4 g/dL — ABNORMAL LOW (ref 3.5–5.0)
Alkaline Phosphatase: 61 U/L (ref 38–126)
Anion gap: 5 (ref 5–15)
BUN: 15 mg/dL (ref 8–23)
CO2: 29 mmol/L (ref 22–32)
Calcium: 9.6 mg/dL (ref 8.9–10.3)
Chloride: 107 mmol/L (ref 98–111)
Creatinine, Ser: 1.11 mg/dL — ABNORMAL HIGH (ref 0.44–1.00)
GFR, Estimated: 51 mL/min — ABNORMAL LOW (ref >60)
Glucose, Bld: 107 mg/dL — ABNORMAL HIGH (ref 70–99)
Potassium: 4.1 mmol/L (ref 3.5–5.1)
Sodium: 141 mmol/L (ref 135–145)
Total Bilirubin: 1.1 mg/dL (ref 0.3–1.2)
Total Protein: 6.2 g/dL — ABNORMAL LOW (ref 6.5–8.1)

## 2022-04-16 LAB — ECHOCARDIOGRAM COMPLETE
AR max vel: 3.05 cm2
AV Area VTI: 3.05 cm2
AV Area mean vel: 3.05 cm2
AV Mean grad: 3 mmHg
AV Peak grad: 6.3 mmHg
Ao pk vel: 1.25 m/s
Area-P 1/2: 2.26 cm2
Height: 65 in
S' Lateral: 2.4 cm
Weight: 3088 oz

## 2022-04-16 LAB — CBC
HCT: 35.6 % — ABNORMAL LOW (ref 36.0–46.0)
Hemoglobin: 11.5 g/dL — ABNORMAL LOW (ref 12.0–15.0)
MCH: 29 pg (ref 26.0–34.0)
MCHC: 32.3 g/dL (ref 30.0–36.0)
MCV: 89.7 fL (ref 80.0–100.0)
Platelets: 255 10*3/uL (ref 150–400)
RBC: 3.97 MIL/uL (ref 3.87–5.11)
RDW: 13.7 % (ref 11.5–15.5)
WBC: 11.4 10*3/uL — ABNORMAL HIGH (ref 4.0–10.5)
nRBC: 0 % (ref 0.0–0.2)

## 2022-04-16 LAB — TSH: TSH: 1.761 u[IU]/mL (ref 0.350–4.500)

## 2022-04-16 LAB — HEMOGLOBIN A1C
Hgb A1c MFr Bld: 5 % (ref 4.8–5.6)
Mean Plasma Glucose: 96.8 mg/dL

## 2022-04-16 MED ORDER — ACETAMINOPHEN 325 MG PO TABS: 650.0000 mg | ORAL_TABLET | Freq: Three times a day (TID) | ORAL | Status: AC | PRN

## 2022-04-16 NOTE — Evaluation (Signed)
Occupational Therapy Evaluation Patient Details Name: Caitlin Johnston MRN: [id number] DOB: 06-04-1941 Today's Date: 04/16/2022   History of Present Illness Pt is a 81 y/o female who presents s/p syncope and fall at home. CT negative for acute changes. PMH significant for lumbar spine surgery ~5 years ago, HTN, CAD, CKD III, B knee scopes.   Clinical Impression   Pt admitted for concerns listed above. PTA pt reported that she is independent with all ADL's and IADL's, including driving. At this time, pt presents near her baseline. She is able to complete ADL's with  no assist and functional mobility with mod I, using a RW. Her vision and cognition are [facility]. BP remained stable and no dizziness per pt report. She has no further skilled OT needs at this time and acute OT will sign off.       Recommendations for follow up therapy are one component of a multi-disciplinary discharge planning process, led by the attending physician.  Recommendations may be updated based on patient status, additional functional criteria and insurance authorization.   Follow Up Recommendations  No OT follow up    Assistance Recommended at Discharge PRN  Patient can return home with the following      Functional Status Assessment  Patient has had a recent decline in their functional status and demonstrates the ability to make significant improvements in function in a reasonable and predictable amount of time.  Equipment Recommendations  None recommended by OT    Recommendations for Other Services       Precautions / Restrictions Precautions Precautions: Fall Restrictions Weight Bearing Restrictions: No      Mobility Bed Mobility Overal bed mobility: Modified Independent Bed Mobility: Supine to Sit, Sit to Supine           General bed mobility comments: HOB elevated, but no assist required for pt to transition to/from EOB.    Transfers Overall transfer level: Needs assistance Equipment used: Rolling  walker (2 wheels) Transfers: Sit to/from Stand Sit to Stand: Supervision           General transfer comment: Sup for safety      Balance Overall balance assessment: Needs assistance Sitting-balance support: Feet supported, No upper extremity supported Sitting balance-Leahy Scale: Fair     Standing balance support: During functional activity, Bilateral upper extremity supported Standing balance-Leahy Scale: Fair Standing balance comment: able tostand at sink and brush teeth with no support                           ADL either performed or assessed with clinical judgement   ADL Overall ADL's : At baseline;Modified independent                                             Vision Baseline Vision/History: 1 Wears glasses Ability to See in Adequate Light: 0 Adequate Patient Visual Report: No change from baseline Vision Assessment?: No apparent visual deficits     Perception     Praxis      Pertinent Vitals/Pain Pain Assessment Pain Assessment: No/denies pain     Hand Dominance Right   Extremity/Trunk Assessment Upper Extremity Assessment Upper Extremity Assessment: Overall WFL for tasks assessed   Lower Extremity Assessment Lower Extremity Assessment: Defer to PT evaluation   Cervical / Trunk Assessment Cervical / Trunk Assessment: Other exceptions Cervical /  Trunk Exceptions: Forward head posture and rounded shoulders   Communication Communication Communication: No difficulties   Cognition Arousal/Alertness: Awake/alert Behavior During Therapy: WFL for tasks assessed/performed Overall Cognitive Status: Within Functional Limits for tasks assessed                                       General Comments  VSS on RA    Exercises     Shoulder Instructions      Home Living Family/patient expects to be discharged to:: Private residence Living Arrangements: Children Available Help at Discharge:  Family;Neighbor;Available 24 hours/day Type of Home: House Home Access: Stairs to enter [employer] of Steps: 1   Home Layout: One level     Bathroom Shower/Tub: Chief Strategy Officer: Standard     Home Equipment: Grab bars - tub/shower;Rolling Environmental consultant (2 wheels);Cane - single point;Hand held Stage manager (4 wheels)          Prior Functioning/Environment Prior Level of Function : Independent/Modified Independent;Driving             Mobility Comments: Utilizes a rollator in the community unless she is at her son's and then she has a RW.          OT Problem List: Decreased strength;Decreased activity tolerance;Impaired balance (sitting and/or standing)      OT Treatment/Interventions:      OT Goals(Current goals can be found in the care plan section) Acute Rehab OT Goals Patient Stated Goal: to go home OT Goal Formulation: With patient Time For Goal Achievement: 04/16/22 Potential to Achieve Goals: Good  OT Frequency:      Co-evaluation              AM-PAC OT "6 Clicks" Daily Activity     Outcome Measure Help from another person eating meals?: None Help from another person taking care of personal grooming?: None Help from another person toileting, which includes using toliet, bedpan, or urinal?: None Help from another person bathing (including washing, rinsing, drying)?: None Help from another person to put on and taking off regular upper body clothing?: None Help from another person to put on and taking off regular lower body clothing?: None 6 Click Score: 24   End of Session Equipment Utilized During Treatment: Rolling walker (2 wheels) Nurse Communication: Mobility status  Activity Tolerance: Patient tolerated treatment well Patient left: in bed;with call bell/phone within reach;with family/visitor present  OT Visit Diagnosis: Unsteadiness on feet (R26.81);Other abnormalities of gait and mobility (R26.89);Muscle  weakness (generalized) (M62.81)                Time: 0814-4818 OT Time Calculation (min): 26 min Charges:  OT General Charges $OT Visit: 1 Visit OT Evaluation $OT Eval Moderate Complexity: 1 Mod OT Treatments $Self Care/Home Management : 8-22 mins  Carliss Quast H., OTR/L Acute Rehabilitation  Reianna Batdorf Elane Margurite Duffy 04/16/2022, 1:46 PM

## 2022-04-16 NOTE — TOC CAGE-AID Note (Signed)
Transition of Care [facility]) - CAGE-AID Screening   Patient Details  Name: Caitlin Johnston MRN: [id number] Date of Birth: Jul 07, 1941  Transition of Care [facility]) CM/SW Contact:    Coralee Pesa, [place] Phone Number: 04/16/2022, 11:26 AM   Clinical Narrative: CSW met with pt at bedside to complete CAGE- AID assessment. Pt denies any drug or alcohol use, no resources needed.   CAGE-AID Screening:    Have You Ever Felt You Ought to Cut Down on Your Drinking or Drug Use?: No Have People Annoyed You By Critizing Your Drinking Or Drug Use?: No Have You Felt Bad Or Guilty About Your Drinking Or Drug Use?: No Have You Ever Had a Drink or Used Drugs First Thing In The Morning to Steady Your Nerves or to Get Rid of a Hangover?: No CAGE-AID Score: 0  Substance Abuse Education Offered: No

## 2022-04-16 NOTE — Progress Notes (Signed)
  Transition of Care [facility]) Screening Note   Patient Details  Name: Caitlin Johnston Date of Birth: [birth year]   Transition of Care [facility]) CM/SW Contact:    Baldemar Lenis, LCSW Phone Number: 04/16/2022, 1:29 PM    Transition of Care Department [facility]) has reviewed patient and no TOC needs have been identified at this time. We will continue to monitor patient advancement through interdisciplinary progression rounds. If new patient transition needs arise, please place a TOC consult.

## 2022-04-16 NOTE — Progress Notes (Signed)
  Echocardiogram 2D Echocardiogram has been performed.  Gerda Diss 04/16/2022, 9:22 AM

## 2022-04-16 NOTE — Progress Notes (Addendum)
Daily Progress Note Intern Pager: [phone]  Patient name: Caitlin Johnston Medical record number: [id number] Date of birth: [birth year] Age: 81 y.o. Gender: female  Primary Care Provider: Pcp, No Consultants: None Code Status: Full  Pt Overview and Major Events to Date:  -04/16/22 Admitted  Assessment and Plan:  Caitlin Johnston is a 81 y.o. female presenting with syncope and fall.  Pertinent past medical history includes hypertension, CAD, and CKD 3. Differential for this patient's presentation of this includes arrhythmia, ACS, valvular pathology, heart failure, stroke, electrolyte abnormality, and orthostasis .  Most likely etiology is arrhythmia with reported history of intermittent VT on Holter monitor, and reported heart rate of 25 with EMS prior to arrival to this hospitalization acute, now resolved. Pertinent PMH/PSH includes HTN, CAD, CKD3a, MDD.   * Syncope and collapse Acute, symptoms resolved.  Most likely arrhythmia, plan to monitor on cardiac telemetry.  Negative studies thus far include CT head and C-spine, EKG, troponin. TSH normal, A1c normal. Orthostatic vital signs normal. ECHO grade 1 diastolic -Repeat EKG in AM -Cardiology curbsided, recommendations to follow-up in less than 1 week with outpatient cardiologist for monitor and no driving  -Continuous cardiac Monitoring -Vitals per floor routine -ECHO -AM CMP and CBC -Hold home Metoprolol for now given bradycardia en route  Head injury Scalp laceration hemostatic at this time, staples applied in ED.  CT head and C-spine negative.  Patient is on aspirin 81 mg daily, she has not missed any doses.  Small potential for delayed brain bleed.  We will continue to monitor closely.  CAD (coronary artery disease) Troponin and EKG unremarkable. N/STEMI unlikely cause of syncopal episode. - Continue home aspirin 80 mg daily - Continue home pravastatin 20 mg daily and Zetia 10 mg daily  Overactive bladder -Continue Myrbetriq and  formulary alternative Toviaz  GERD (gastroesophageal reflux disease) -Continue Prilosec and Tums  MDD (major depressive disorder) -Continue home Zoloft  Degenerative disc disease, lumbar No complaints of lumbar pain at this time after fall.  Patient does have home tramadol 50 mg tablets every 6 as needed, which she reports taking very sparingly.  Will not order at this time, would start with more conservative measures first such as lidocaine patch and tylenol.        FEN/GI: Heart healthy PPx: Lovenox Dispo:Pending echo   Subjective:  NAEO. Eating well. States she had some dizziness with rolling over in bed. No chest pain, nausea vomiting. Reports some shortness of breath when she got up to go to the bathroom.  Objective: Temp:  [98 F (36.7 C)-98.4 F (36.9 C)] 98.3 F (36.8 C) (07/28 0824) Pulse Rate:  [59-72] 70 (07/28 0824) Resp:  [9-23] 18 (07/28 0824) BP: (110-163)/(44-86) 131/86 (07/28 0824) SpO2:  [92 %-99 %] 99 % (07/28 0824) Physical Exam: General: NAD, sitting comfortably in hospital bed Cardiovascular: RRR, no murmurs, no peripheral edema Respiratory: normal WOB on RA, CTAB, no wheezes, ronchi or rales Abdomen: soft, NTTP, no rebound or guarding Extremities: Moving all 4 extremities equally   Laboratory: Most recent CBC Lab Results  Component Value Date   WBC 11.4 (H) 04/16/2022   HGB 11.5 (L) 04/16/2022   HCT 35.6 (L) 04/16/2022   MCV 89.7 04/16/2022   PLT 255 04/16/2022   Most recent BMP    Latest Ref Rng & Units 04/16/2022    4:59 AM  BMP  Glucose 70 - 99 mg/dL 578   BUN 8 - 23 mg/dL 15   Creatinine 4.69 -  1.00 mg/dL 9.51   Sodium 884 - 166 mmol/L 141   Potassium 3.5 - 5.1 mmol/L 4.1   Chloride 98 - 111 mmol/L 107   CO2 22 - 32 mmol/L 29   Calcium 8.9 - 10.3 mg/dL 9.6     Other pertinent labs: A1c  - 5.0%, TSH 1.761  Imaging/Diagnostic Tests: No new imaging. Celine Mans, MD 04/16/2022, 11:39 AM  PGY-1, [facility] Family  Medicine FPTS Intern pager: [phone], text pages welcome Secure chat group [facility] Teaching Service

## 2022-04-16 NOTE — Discharge Summary (Signed)
Family Medicine Teaching [facility] Discharge Summary  Patient name: Caitlin Johnston Medical record number: [id number] Date of birth: [birth year] Age: 81 y.o. Gender: female Date of Admission: 04/15/2022  Date of Discharge: 04/16/22 Admitting Physician: Celine Mans, MD  Primary Care Provider: Pcp, No Consultants: Cardiology  Indication for Hospitalization: syncope and fall  Brief Hospital Course:  Caitlin Johnston is a 81 year old female who presented with syncope and fall.  Pertinent past medical history of CAD, hypertension, uncertain arrhythmia.  Patient was admitted to family medicine teaching service for syncope work-up. Please see problem based hospital course below:  Syncope and fall Patient arrived to the hospital after syncope and fall.  EMS noted heart rate to be 25 en route.  In the ED vitals were stable, electrolytes normal, troponin flat, CT head negative, and EKG was normal. Home metoprolol was held given concern for profound bradycardia with EMS. A1c, TSH, and orthostatic vitals also unremarkable. ECHO showed Grade 1 diastolic dysfunction but was otherwise unremarkable. Patient remained asymptomatic with normal heart rate while in hospital. No arrhythmias noted on continuous telemetry.  Spoke with cardiology, who recommended follow-up in 1-week with her established cardiologist for outpatient cardiac monitoring. Patient discharged in stable condition. Home metoprolol held on discharge.  Follow up recommendations: Stopped metoprolol on admission out of concern for bradycardia to 25 bpm in ambulance. PCP to follow up tolerance. Would be hesitant to restart given possibility of episodic bradycardia.  Recommend outpatient cardiology follow up. Would recommend Zio patch. Our records indicate she sees [facility] cardiology already.  Reports dizziness with certain head turn motions. Consider BPV. May need neurovestibular eval and treat.     Disposition: home  Discharge  Condition: stable  Discharge Exam:  Vitals:   04/15/22 2312 04/16/22 0824  BP: (!) 117/51 131/86  Pulse: 63 70  Resp: 17 18  Temp: 98.2 F (36.8 C) 98.3 F (36.8 C)  SpO2: 93% 99%   Physical Exam: General: NAD, sitting comfortably in hospital bed Cardiovascular: RRR, no murmurs, no peripheral edema Respiratory: normal WOB on RA, CTAB, no wheezes, ronchi or rales Abdomen: soft, NTTP, no rebound or guarding Extremities: Moving all 4 extremities equally  Orthostatic VS for the past 24 hrs:  BP- Lying Pulse- Lying BP- Sitting Pulse- Sitting BP- Standing at 0 minutes Pulse- Standing at 0 minutes  04/16/22 1100 117/60 65 133/69 68 120/59 81  04/16/22 0518 128/54 62 131/67 67 121/73 71   Significant Procedures: none  Significant Labs and Imaging:  Recent Labs  Lab 04/15/22 1101 04/16/22 0459  WBC 12.5* 11.4*  HGB 11.5* 11.5*  HCT 35.0* 35.6*  PLT 279 255   Recent Labs  Lab 04/15/22 1101 04/16/22 0459  NA 141 141  K 4.5 4.1  CL 111 107  CO2 23 29  GLUCOSE 110* 107*  BUN 12 15  CREATININE 0.93 1.11*  CALCIUM 8.6* 9.6  ALKPHOS  --  61  AST  --  11*  ALT  --  9  ALBUMIN  --  3.4*    Pertinent Imaging:  CT Cervical Spine Wo Contrast  IMPRESSION: Generalized atrophy. No acute intracranial abnormalities. Multilevel degenerative disc and facet disease changes of the cervical spine. No acute cervical spine abnormalities.  CT Head Wo Contrast  IMPRESSION: Generalized atrophy. No acute intracranial abnormalities. Multilevel degenerative disc and facet disease changes of the cervical spine. No acute cervical spine abnormalities.  ECHOCARDIOGRAM COMPLETE IMPRESSIONS   1. Left ventricular ejection fraction, by estimation, is 60 to 65%. The  left ventricle has normal function. The left ventricle has no regional  wall motion abnormalities. Left ventricular diastolic parameters are  consistent with Grade I diastolic  dysfunction (impaired relaxation).   2.  Right ventricular systolic function is normal. The right ventricular  size is normal. There is normal pulmonary artery systolic pressure.   3. The mitral valve is grossly normal. No evidence of mitral valve  regurgitation.   4. The aortic valve is tricuspid. Aortic valve regurgitation is not  visualized.   5. The inferior vena cava is normal in size with greater than 50%  respiratory variability, suggesting right atrial pressure of 3 mmHg.   Results/Tests Pending at Time of Discharge: none  Discharge Medications:  Allergies as of 04/16/2022       Reactions   Avocado Anaphylaxis, Hives   Cat Hair Extract Anaphylaxis   Cephalexin Anaphylaxis   Codeine Anaphylaxis   Peach Flavor Anaphylaxis   Thyme Anaphylaxis, Hives   Tree Nuts [nuts] Anaphylaxis, Hives   Tylenol [acetaminophen]    Causes liver failure   Ducodyl [bisacodyl]         Medication List     STOP taking these medications    metoprolol succinate 50 MG 24 hr tablet Commonly known as: TOPROL-XL       TAKE these medications    acetaminophen 325 MG tablet Commonly known as: TYLENOL Take 2 tablets (650 mg total) by mouth every 8 (eight) hours as needed for headache (mild pain, fever >100.4).   aspirin EC 81 MG tablet Take 81 mg by mouth daily.   calcium carbonate 500 MG chewable tablet Commonly known as: TUMS - dosed in mg elemental calcium Chew 1 tablet by mouth daily.   cyanocobalamin 1000 MCG tablet Commonly known as: VITAMIN B12 Take 1,000 mcg by mouth daily.   estradiol 0.1 MG/GM vaginal cream Commonly known as: ESTRACE Place 1 Applicatorful vaginally 2 (two) times a week.   ezetimibe 10 MG tablet Commonly known as: ZETIA Take 10 mg by mouth daily.   fexofenadine 180 MG tablet Commonly known as: ALLEGRA Take 180 mg by mouth daily as needed for allergies.   fluticasone 50 MCG/ACT nasal spray Commonly known as: FLONASE Place 1 spray into both nostrils daily as needed for allergies or  rhinitis.   HM MULTIVITAMIN ADULT GUMMY PO Take 1 tablet by mouth daily.   mirabegron ER 50 MG Tb24 tablet Commonly known as: MYRBETRIQ Take 50 mg by mouth daily.   omeprazole 20 MG capsule Commonly known as: PRILOSEC Take 20 mg by mouth 2 (two) times daily before a meal.   pravastatin 20 MG tablet Commonly known as: PRAVACHOL Take 20 mg by mouth daily.   sertraline 100 MG tablet Commonly known as: ZOLOFT Take 100 mg by mouth daily.   solifenacin 5 MG tablet Commonly known as: VESICARE Take 5 mg by mouth daily.   vitamin C 250 MG tablet Commonly known as: ASCORBIC ACID Take 250 mg by mouth daily.       Discharge Instructions: Please refer to Patient Instructions section of EMR for full details.  Patient was counseled important signs and symptoms that should prompt return to medical care, changes in medications, dietary instructions, activity restrictions, and follow up appointments.    Celine Mans, MD 04/16/2022, 11:48 AM PGY-1, [facility]  Upper Level Addendum: I have seen and evaluated this patient along with Dr. Velna Ochs and reviewed the above note, making necessary revisions as appropriate. I agree with the medical decision  making and physical exam as noted above. Ezequiel Essex, MD PGY-3 [facility]

## 2022-04-16 NOTE — Evaluation (Signed)
Physical Therapy Evaluation  Patient Details Name: Caitlin Johnston MRN: [id number] DOB: 14-May-1941 Today's Date: 04/16/2022  History of Present Illness  Pt is a 81 y/o female who presents s/p syncope and fall at home. CT negative for acute changes. PMH significant for lumbar spine surgery ~5 years ago, HTN, CAD, CKD III, B knee scopes.   Clinical Impression  Pt admitted with above diagnosis. Pt currently with functional limitations due to the deficits listed below (see PT Problem List). At the time of PT eval pt was able to perform transfers and ambulation with gross supervision for safety and RW for support. Pt reports feeling near baseline of function and states she has all the needed DME at home. Orthostatics negative, please see Vitals Flowsheet for details. Acutely, pt will benefit from skilled PT to increase their independence and safety with mobility to allow discharge to the venue listed below.          Recommendations for follow up therapy are one component of a multi-disciplinary discharge planning process, led by the attending physician.  Recommendations may be updated based on patient status, additional functional criteria and insurance authorization.  Follow Up Recommendations No PT follow up      Assistance Recommended at Discharge PRN  Patient can return home with the following  A little help with walking and/or transfers;Assistance with cooking/housework;Assist for transportation;Help with stairs or ramp for entrance    Equipment Recommendations None recommended by PT  Recommendations for Other Services       Functional Status Assessment Patient has had a recent decline in their functional status and demonstrates the ability to make significant improvements in function in a reasonable and predictable amount of time.     Precautions / Restrictions Precautions Precautions: Fall Restrictions Weight Bearing Restrictions: No      Mobility  Bed Mobility Overal bed  mobility: Modified Independent Bed Mobility: Supine to Sit, Sit to Supine           General bed mobility comments: HOB elevated, but no assist required for pt to transition to/from EOB.    Transfers Overall transfer level: Needs assistance Equipment used: Rolling walker (2 wheels) Transfers: Sit to/from Stand Sit to Stand: Supervision           General transfer comment: Pt demonstrated proper hand placement on seated surface for safety.    Ambulation/Gait Ambulation/Gait assistance: Supervision Gait Distance (Feet): 175 Feet Assistive device: Rolling walker (2 wheels) Gait Pattern/deviations: Step-through pattern, Decreased stride length, Trunk flexed Gait velocity: Decreased Gait velocity interpretation: 1.31 - 2.62 ft/sec, indicative of limited community ambulator   General Gait Details: Slow but generally steady with RW for support. Pt states her gait speed is near baseline and is able to improve gait speed with cues. No gross unsteadiness or LOB noted.  Stairs            Wheelchair Mobility    Modified Rankin (Stroke Patients Only)       Balance Overall balance assessment: Needs assistance Sitting-balance support: Feet supported, No upper extremity supported Sitting balance-Leahy Scale: Fair     Standing balance support: During functional activity, Bilateral upper extremity supported Standing balance-Leahy Scale: Poor Standing balance comment: Reliant on RW                             Pertinent Vitals/Pain Pain Assessment Pain Assessment: No/denies pain    Home Living Family/patient expects to be discharged to:: Private residence Living  Arrangements: Children Available Help at Discharge: Family;Neighbor;Available 24 hours/day Type of Home: House Home Access: Stairs to enter   [employer] of Steps: 1   Home Layout: One level Home Equipment: Grab bars - tub/shower;Rolling Walker (2 wheels);Cane - single point;Hand held  Stage manager (4 wheels)      Prior Function Prior Level of Function : Independent/Modified Independent;Driving             Mobility Comments: Utilizes a rollator in the community unless she is at her son's and then she has a RW.       Hand Dominance   Dominant Hand: Right    Extremity/Trunk Assessment   Upper Extremity Assessment Upper Extremity Assessment: Defer to OT evaluation    Lower Extremity Assessment Lower Extremity Assessment: Generalized weakness (Mild; Likely baseline. Pt reports baseline knee problems)    Cervical / Trunk Assessment Cervical / Trunk Assessment: Other exceptions Cervical / Trunk Exceptions: Forward head posture and rounded shoulders  Communication   Communication: No difficulties  Cognition Arousal/Alertness: Awake/alert Behavior During Therapy: WFL for tasks assessed/performed Overall Cognitive Status: Within Functional Limits for tasks assessed                                          General Comments      Exercises     Assessment/Plan    PT Assessment Patient needs continued PT services  PT Problem List Decreased strength;Decreased activity tolerance;Decreased balance;Decreased mobility;Decreased cognition;Decreased knowledge of use of DME;Decreased safety awareness;Decreased knowledge of precautions       PT Treatment Interventions DME instruction;Gait training;Functional mobility training;Therapeutic activities;Therapeutic exercise;Balance training;Patient/family education    PT Goals (Current goals can be found in the Care Plan section)  Acute Rehab PT Goals Patient Stated Goal: Discharge ASAP PT Goal Formulation: With patient/family Time For Goal Achievement: 04/23/22 Potential to Achieve Goals: Good    Frequency Min 3X/week     Co-evaluation               AM-PAC PT "6 Clicks" Mobility  Outcome Measure Help needed turning from your back to your side while in a flat bed without using  bedrails?: None Help needed moving from lying on your back to sitting on the side of a flat bed without using bedrails?: None Help needed moving to and from a bed to a chair (including a wheelchair)?: A Little Help needed standing up from a chair using your arms (e.g., wheelchair or bedside chair)?: A Little Help needed to walk in hospital room?: A Little Help needed climbing 3-5 steps with a railing? : A Little 6 Click Score: 20    End of Session Equipment Utilized During Treatment: Gait belt Activity Tolerance: Patient tolerated treatment well Patient left: in bed;with call bell/phone within reach;with family/visitor present Nurse Communication: Mobility status PT Visit Diagnosis: Unsteadiness on feet (R26.81);History of falling (Z91.81)    Time: 7564-3329 PT Time Calculation (min) (ACUTE ONLY): 28 min   Charges:   PT Evaluation $PT Eval Moderate Complexity: 1 Mod PT Treatments $Gait Training: 8-22 mins        Conni Slipper, PT, DPT Acute Rehabilitation Services Secure Chat Preferred Office: [phone]   Marylynn Pearson 04/16/2022, 1:38 PM
